# Patient Record
Sex: Female | Born: 1995 | Race: Black or African American | Hispanic: No | Marital: Single | Smoking: Never smoker
Health system: Southern US, Community
[De-identification: ages and names within clinical notes are randomized; demographics above are authoritative.]

## PROBLEM LIST (undated history)

## (undated) DIAGNOSIS — E559 Vitamin D deficiency, unspecified: Secondary | ICD-10-CM

## (undated) DIAGNOSIS — J45909 Unspecified asthma, uncomplicated: Secondary | ICD-10-CM

## (undated) DIAGNOSIS — F32A Depression, unspecified: Secondary | ICD-10-CM

## (undated) DIAGNOSIS — J302 Other seasonal allergic rhinitis: Secondary | ICD-10-CM

## (undated) DIAGNOSIS — F329 Major depressive disorder, single episode, unspecified: Secondary | ICD-10-CM

## (undated) DIAGNOSIS — D649 Anemia, unspecified: Secondary | ICD-10-CM

## (undated) DIAGNOSIS — M62838 Other muscle spasm: Secondary | ICD-10-CM

## (undated) DIAGNOSIS — R519 Headache, unspecified: Secondary | ICD-10-CM

## (undated) DIAGNOSIS — F419 Anxiety disorder, unspecified: Secondary | ICD-10-CM

## (undated) DIAGNOSIS — K219 Gastro-esophageal reflux disease without esophagitis: Secondary | ICD-10-CM

## (undated) HISTORY — DX: Anemia, unspecified: D64.9

## (undated) HISTORY — DX: Unspecified asthma, uncomplicated: J45.909

## (undated) HISTORY — DX: Other seasonal allergic rhinitis: J30.2

## (undated) HISTORY — DX: Headache, unspecified: R51.9

## (undated) HISTORY — DX: Depression, unspecified: F32.A

## (undated) HISTORY — DX: Gastro-esophageal reflux disease without esophagitis: K21.9

## (undated) HISTORY — DX: Other muscle spasm: M62.838

## (undated) HISTORY — DX: Vitamin D deficiency, unspecified: E55.9

## (undated) HISTORY — DX: Anxiety disorder, unspecified: F41.9

## (undated) HISTORY — PX: WISDOM TOOTH EXTRACTION: SHX21

---
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
## 2019-10-20 ENCOUNTER — Ambulatory Visit: Payer: Self-pay | Admitting: Neurology

## 2019-12-07 ENCOUNTER — Encounter: Payer: Self-pay | Admitting: Neurology

## 2019-12-07 ENCOUNTER — Other Ambulatory Visit: Payer: Self-pay

## 2019-12-07 ENCOUNTER — Ambulatory Visit: Payer: BC Managed Care – PPO | Admitting: Neurology

## 2019-12-07 VITALS — BP 121/80 | HR 70 | Temp 97.9°F | Ht 62.0 in | Wt 162.0 lb

## 2019-12-07 DIAGNOSIS — G43709 Chronic migraine without aura, not intractable, without status migrainosus: Secondary | ICD-10-CM

## 2019-12-07 DIAGNOSIS — R569 Unspecified convulsions: Secondary | ICD-10-CM | POA: Diagnosis not present

## 2019-12-07 DIAGNOSIS — IMO0002 Reserved for concepts with insufficient information to code with codable children: Secondary | ICD-10-CM | POA: Insufficient documentation

## 2019-12-07 MED ORDER — SUMATRIPTAN SUCCINATE 50 MG PO TABS: 50.0000 mg | ORAL_TABLET | ORAL | 6 refills | Status: DC | PRN

## 2019-12-07 NOTE — Progress Notes (Signed)
PATIENT: Lisa Potts DOB: August 19, 1996  Chief Complaint  Patient presents with  . Spasms    She is here with her mother, Steward Drone. She started having intermittent spasms in her abdomen in October 2020.  At first, her symptoms would occur all day. The frequency of the spasms have improved but still happen about ten minutes out of every day. Still only in the abdomen.   Marland Kitchen Headache    In September 2020, she had a daily headache for one month. She now estimates one headache day per week. The pain is throbbing in nature and sometimes responds to OTC NSAIDS. She will have nausea at times.   Marland Kitchen PCP    Lawerance Sabal, [place]     HISTORICAL  Lisa Potts is a 24 year old female, seen in request by her primary care PA [place], [place] for evaluation of abdomen spasm, frequent headaches, initial evaluation was on December 07, 2019.  I have reviewed and summarized the referring note from the referring physician.  She had past medical history of depression, anxiety, taking BuSpar 10 mg 3 times a day, Paxil 10 mg daily  She began to experience intermittent headaches since middle school, oftentimes lateralized with associated light noise sensitivity, pounding headaches, nausea, lasting for few hours  She went through stressful life event in June 2020, since then she suffered significant depression anxiety, starting receiving treatment since October, BuSpar 10 mg 3 times a day, Paxil 10 mg daily was added on since February 2021 for suboptimal control of her depression anxiety symptoms  Since June 2020, she began to experience increased frequency of headache, for a while, she was having daily headache lasting for 1 month, sometimes holoacranial, sometimes lateralized, failed to respond to over-the-counter medication, Excedrin migraine headaches, NSAIDs, with her depression treatment, her symptoms has much improved, she now only have headaches about once to twice each week  Also around September 2020, she began  to experience episode of body tremor, normal muscle tightness, to the point of drawing her body towards her knee, also has intermittent whole body tremor, lasting for few minutes to hours, without loss of consciousness  Laboratory evaluations in October 2020, normal iron panel, ferritin 105, B12 484, mild anemia, hemoglobin of 10.9, normal TSH, CMP,  REVIEW OF SYSTEMS: Full 14 system review of systems performed and notable only for as above All other review of systems were negative.  ALLERGIES: No Known Allergies  HOME MEDICATIONS: Current Outpatient Medications  Medication Sig Dispense Refill  . budesonide-formoterol (SYMBICORT) 160-4.5 MCG/ACT inhaler Inhale 2 puffs into the lungs 2 (two) times daily.    . busPIRone (BUSPAR) 10 MG tablet Take 10 mg by mouth 3 (three) times daily.    . cetirizine (ZYRTEC) 10 MG tablet Take 10 mg by mouth daily.    Marland Kitchen esomeprazole (NEXIUM) 40 MG capsule Take 40 mg by mouth daily at 12 noon.    . Ferrous Sulfate (IRON) 325 (65 Fe) MG TABS Take 2 tablets by mouth daily.    . montelukast (SINGULAIR) 10 MG tablet Take 10 mg by mouth at bedtime.    Marland Kitchen PARoxetine (PAXIL) 10 MG tablet Take 10 mg by mouth daily.    Marland Kitchen VITAMIN D PO Take 2,000 Units by mouth daily.     No current facility-administered medications for this visit.    PAST MEDICAL HISTORY: Past Medical History:  Diagnosis Date  . Acid reflux   . Anemia   . Anxiety   . Asthma   . Depression   .  Headache   . Muscle spasm   . Seasonal allergies   . Vitamin D deficiency     PAST SURGICAL HISTORY: Past Surgical History:  Procedure Laterality Date  . WISDOM TOOTH EXTRACTION      FAMILY HISTORY: Family History  Problem Relation Age of Onset  . Healthy Mother   . Healthy Father     SOCIAL HISTORY: Social History   Socioeconomic History  . Marital status: Single    Spouse name: Not on file  . Number of children: 0  . Years of education: 5  . Highest education level: High school  graduate  Occupational History  . Occupation: unemployed  Tobacco Use  . Smoking status: Never Smoker  . Smokeless tobacco: Never Used  Substance and Sexual Activity  . Alcohol use: Not Currently  . Drug use: Never  . Sexual activity: Not on file  Other Topics Concern  . Not on file  Social History Narrative   Right-handed.   No daily use of caffeine.   Lives with parents.   Social Determinants of Health   Financial Resource Strain:   . Difficulty of Paying Living Expenses: Not on file  Food Insecurity:   . Worried About Charity fundraiser in the Last Year: Not on file  . Ran Out of Food in the Last Year: Not on file  Transportation Needs:   . Lack of Transportation (Medical): Not on file  . Lack of Transportation (Non-Medical): Not on file  Physical Activity:   . Days of Exercise per Week: Not on file  . Minutes of Exercise per Session: Not on file  Stress:   . Feeling of Stress : Not on file  Social Connections:   . Frequency of Communication with Friends and Family: Not on file  . Frequency of Social Gatherings with Friends and Family: Not on file  . Attends Religious Services: Not on file  . Active Member of Clubs or Organizations: Not on file  . Attends Archivist Meetings: Not on file  . Marital Status: Not on file  Intimate Partner Violence:   . Fear of Current or Ex-Partner: Not on file  . Emotionally Abused: Not on file  . Physically Abused: Not on file  . Sexually Abused: Not on file     PHYSICAL EXAM   Vitals:   12/07/19 0936  BP: 121/80  Pulse: 70  Temp: 97.9 F (36.6 C)  Weight: 162 lb (73.5 kg)  Height: 5\' 2"  (1.575 m)    Not recorded      Body mass index is 29.63 kg/m.  PHYSICAL EXAMNIATION:  Gen: NAD, conversant, well nourised, well groomed                     Cardiovascular: Regular rate rhythm, no peripheral edema, warm, nontender. Eyes: Conjunctivae clear without exudates or hemorrhage Neck: Supple, no carotid bruits.  Pulmonary: Clear to auscultation bilaterally   NEUROLOGICAL EXAM:  MENTAL STATUS: Speech:    Speech is normal; fluent and spontaneous with normal comprehension.  Cognition:     Orientation to time, place and person     Normal recent and remote memory     Normal Attention span and concentration     Normal Language, naming, repeating,spontaneous speech     Fund of knowledge   CRANIAL NERVES: CN II: Visual fields are full to confrontation. Pupils are round equal and briskly reactive to light. CN III, IV, VI: extraocular movement are normal. No ptosis.  CN V: Facial sensation is intact to light touch CN VII: Face is symmetric with normal eye closure  CN VIII: Hearing is normal to causal conversation. CN IX, X: Phonation is normal. CN XI: Head turning and shoulder shrug are intact  MOTOR: There is no pronator drift of out-stretched arms. Muscle bulk and tone are normal. Muscle strength is normal.  REFLEXES: Reflexes are 2+ and symmetric at the biceps, triceps, knees, and ankles. Plantar responses are flexor.  SENSORY: Intact to light touch, pinprick and vibratory sensation are intact in fingers and toes.  COORDINATION: There is no trunk or limb dysmetria noted.  GAIT/STANCE: Posture is normal. Gait is steady with normal steps, base, arm swing, and turning. Heel and toe walking are normal. Tandem gait is normal.  Romberg is absent.   DIAGNOSTIC DATA (LABS, IMAGING, TESTING) - I reviewed patient records, labs, notes, testing and imaging myself where available.   ASSESSMENT AND PLAN  Arshi Cottier is a 24 y.o. female   Chronic migraine headaches  Imitrex 50 mg as needed  Seizure-like activity  In the setting of anxiety, most likely related to her mood disorder,  She desires complete evaluations, will proceed with MRI of the brain, EEG,  She will continue follow-up with her psychiatrist or primary care for better management of her mood disorder, only return to neurology  clinic if any of the above test is abnormal   Levert Feinstein, M.D. Ph.D.  [facility] [address] [place], [place] [address] Ph: [phone] Fax: [phone]  CC: Lawerance Sabal, [place]

## 2019-12-14 ENCOUNTER — Telehealth: Payer: Self-pay | Admitting: Neurology

## 2019-12-14 NOTE — Telephone Encounter (Signed)
spoke to the patient she does not want to have the MRI at this time  Yetta Numbers: [id number] (exp. 12/13/19 to 01/11/20)

## 2019-12-25 ENCOUNTER — Other Ambulatory Visit: Payer: BC Managed Care – PPO

## 2023-06-30 ENCOUNTER — Encounter: Payer: Self-pay | Admitting: Internal Medicine

## 2023-06-30 ENCOUNTER — Ambulatory Visit (INDEPENDENT_AMBULATORY_CARE_PROVIDER_SITE_OTHER): Payer: Self-pay | Admitting: Internal Medicine

## 2023-06-30 VITALS — BP 113/80 | HR 69 | Temp 97.5°F | Ht 63.0 in | Wt 162.1 lb

## 2023-06-30 DIAGNOSIS — K581 Irritable bowel syndrome with constipation: Secondary | ICD-10-CM

## 2023-06-30 DIAGNOSIS — K219 Gastro-esophageal reflux disease without esophagitis: Secondary | ICD-10-CM

## 2023-06-30 DIAGNOSIS — R1319 Other dysphagia: Secondary | ICD-10-CM

## 2023-06-30 MED ORDER — ESOMEPRAZOLE MAGNESIUM 40 MG PO CPDR: 40.0000 mg | DELAYED_RELEASE_CAPSULE | Freq: Two times a day (BID) | ORAL | 11 refills | Status: DC

## 2023-06-30 NOTE — Patient Instructions (Addendum)
I am going to increase your generic Nexium to twice daily.  Take first dose 30 minutes before breakfast second dose you can take before dinner or at bedtime.  If you gain insurance then let us know we can schedule you for upper endoscopy to further evaluate your symptoms.  For your constipation, I want you to start taking over the counter MiraLAX 1 capful daily.  If this does not adequately control your constipation, I would increase to 2 capfuls daily.  If this is still not adequate, then I would add on once daily Dulcolax (bisacodyl) tablet.   Be sure to drink at least 4 to 6 glasses of water daily.   Follow up in 6 months or sooner if needed.   It was very nice meeting you today.  Dr. Marletta Lor  Lifestyle and home remedies TO MANAGE REFLUX/HEARTBURN    You may eliminate or reduce the frequency of heartburn by making the following lifestyle changes:   Control your weight. Being overweight is a major risk factor for heartburn and GERD. Excess pounds put pressure on your abdomen, pushing up your stomach and causing acid to back up into your esophagus.    Eat smaller meals. 4 TO 6 MEALS A DAY. This reduces pressure on the lower esophageal sphincter, helping to prevent the valve from opening and acid from washing back into your esophagus.     Loosen your belt. Clothes that fit tightly around your waist put pressure on your abdomen and the lower esophageal sphincter.     Eliminate heartburn triggers. Everyone has specific triggers. Common triggers such as fatty or fried foods, spicy food, tomato sauce, carbonated beverages, alcohol, chocolate, mint, garlic, onion, caffeine and nicotine may make heartburn worse.    Avoid stooping or bending. Tying your shoes is OK. Bending over for longer periods to weed your garden isn't, especially soon after eating.    Don't lie down after a meal. Wait at least three to four hours after eating before going to bed, and don't lie down right after eating.

## 2023-06-30 NOTE — Progress Notes (Signed)
Primary Care Physician:  Lawerance Sabal, [place] Primary Gastroenterologist:  Dr. Marletta Lor  Chief Complaint  Patient presents with   New Patient (Initial Visit)    Patient here today due to issues with acid reflux. She is taking Esomeprazole 40 mg once daily. Patient says this is helping.    HPI:   Lisa Potts is a 27 y.o. female who presents to the clinic today by referral from her PCP Marveen Reeks for evaluation.  Patient states she has been dealing with worsening acid reflux for months.  Was taking over-the-counter Prilosec twice daily.  Having breakthrough symptoms, recently switched to esomeprazole 40 mg daily.  States her symptoms are improved though continues to have epigastric burning and pain 2-3 times daily.  Has started a food diary to identify certain triggers.    Also notes intermittent dysphagia feels like food gets stuck in her chest.  No regurgitation.  Eventually goes down.  Does have associated allergic rhinitis and asthma.  No previous upper endoscopy.  No melena hematochezia.  Also reports history of IBS.  Currently taking dicyclomine up to 4 times daily.  Does note some constipation type symptoms, occasional straining.  Abdominal pain improved on dicyclomine.  No previous colonoscopy.  No family history of colorectal malignancy.  Patient just turned 27 and is no longer under her parents insurance.  Currently paying out-of-pocket for everything.  Past Medical History:  Diagnosis Date   Acid reflux    Anemia    Anxiety    Asthma    Depression    Headache    Muscle spasm    Seasonal allergies    Vitamin D deficiency     Past Surgical History:  Procedure Laterality Date   WISDOM TOOTH EXTRACTION      Current Outpatient Medications  Medication Sig Dispense Refill   budesonide-formoterol (SYMBICORT) 160-4.5 MCG/ACT inhaler Inhale 2 puffs into the lungs 2 (two) times daily.     busPIRone (BUSPAR) 10 MG tablet Take 10 mg by mouth 3 (three) times daily.      cetirizine (ZYRTEC) 10 MG tablet Take 10 mg by mouth daily.     esomeprazole (NEXIUM) 40 MG capsule Take 40 mg by mouth daily at 12 noon.     montelukast (SINGULAIR) 10 MG tablet Take 10 mg by mouth at bedtime.     PARoxetine (PAXIL) 10 MG tablet Take 10 mg by mouth daily.     VITAMIN D PO Take 2,000 Units by mouth daily.     No current facility-administered medications for this visit.    Allergies as of 06/30/2023   (No Known Allergies)    Family History  Problem Relation Age of Onset   Healthy Mother    Healthy Father     Social History   Socioeconomic History   Marital status: Single    Spouse name: Not on file   Number of children: 0   Years of education: 12   Highest education level: High school graduate  Occupational History   Occupation: unemployed  Tobacco Use   Smoking status: Never   Smokeless tobacco: Never  Vaping Use   Vaping status: Never Used  Substance and Sexual Activity   Alcohol use: Not Currently   Drug use: Never   Sexual activity: Not on file  Other Topics Concern   Not on file  Social History Narrative   Right-handed.   No daily use of caffeine.   Lives with parents.   Social Determinants of Health  Financial Resource Strain: Not on file  Food Insecurity: Not on file  Transportation Needs: Not on file  Physical Activity: Not on file  Stress: Not on file  Social Connections: Not on file  Intimate Partner Violence: Not on file    Subjective: Review of Systems  Constitutional:  Negative for chills and fever.  HENT:  Negative for congestion and hearing loss.   Eyes:  Negative for blurred vision and double vision.  Respiratory:  Negative for cough and shortness of breath.   Cardiovascular:  Negative for chest pain and palpitations.  Gastrointestinal:  Positive for abdominal pain, constipation and heartburn. Negative for blood in stool, diarrhea, melena and vomiting.       Dysphagia  Genitourinary:  Negative for dysuria and urgency.   Musculoskeletal:  Negative for joint pain and myalgias.  Skin:  Negative for itching and rash.  Neurological:  Negative for dizziness and headaches.  Psychiatric/Behavioral:  Negative for depression. The patient is not nervous/anxious.        Objective: BP 113/80 (BP Location: Left Arm, Patient Position: Sitting, Cuff Size: Large)   Pulse 69   Temp (!) 97.5 F (36.4 C) (Temporal)   Ht 5\' 3"  (1.6 m)   Wt 162 lb 1.6 oz (73.5 kg)   LMP 06/28/2023 (Approximate)   BMI 28.71 kg/m  Physical Exam Constitutional:      Appearance: Normal appearance.  HENT:     Head: Normocephalic and atraumatic.  Eyes:     Extraocular Movements: Extraocular movements intact.     Conjunctiva/sclera: Conjunctivae normal.  Cardiovascular:     Rate and Rhythm: Normal rate and regular rhythm.  Pulmonary:     Effort: Pulmonary effort is normal.     Breath sounds: Normal breath sounds.  Abdominal:     General: Bowel sounds are normal.     Palpations: Abdomen is soft.  Musculoskeletal:        General: No swelling. Normal range of motion.     Cervical back: Normal range of motion and neck supple.  Skin:    General: Skin is warm and dry.     Coloration: Skin is not jaundiced.  Neurological:     General: No focal deficit present.     Mental Status: She is alert and oriented to person, place, and time.  Psychiatric:        Mood and Affect: Mood normal.        Behavior: Behavior normal.      Assessment/Plan:  1.  Chronic GERD, esophageal dysphagia-patient at risk for eosinophilic esophagitis with concurrent allergic rhinitis and asthma.  Discussed this in depth with her today.  Unfortunately she does not have insurance is very concerned about potential cost of upper endoscopy.  We will hold off for now.  Increase esomeprazole to twice daily.  Good Rx coupon provided today.  Patient currently applying for insurance, if obtained, she will call office and we can schedule her for upper endoscopy to  further evaluate.  ASA 2.  2.  Abdominal pain/constipation-likely has component of irritable bowel syndrome constipation predominant.  Currently taking dicyclomine.  Will continue. For patient's constipation, I recommended taking MiraLAX 1 capful daily.  If this is not adequate then increase to 2 capfuls daily.  If this is still not adequate then add on once daily Dulcolax.  Encouraged to drink at least 6 glasses of water daily.  Follow-up in 6 months or sooner if needed.  Thank you Kris Hartmann for the kind referral  06/30/2023  9:44 AM   Disclaimer: This note was dictated with voice recognition software. Similar sounding words can inadvertently be transcribed and may not be corrected upon review.

## 2023-12-28 ENCOUNTER — Ambulatory Visit (INDEPENDENT_AMBULATORY_CARE_PROVIDER_SITE_OTHER): Payer: Self-pay | Admitting: Gastroenterology

## 2023-12-28 VITALS — BP 115/73 | HR 76 | Temp 97.7°F | Ht 63.0 in | Wt 159.0 lb

## 2023-12-28 DIAGNOSIS — K5900 Constipation, unspecified: Secondary | ICD-10-CM

## 2023-12-28 DIAGNOSIS — K219 Gastro-esophageal reflux disease without esophagitis: Secondary | ICD-10-CM

## 2023-12-28 NOTE — Patient Instructions (Signed)
 Continue Nexium twice a day.   Make sure you are not laying down for at least 2-3 hours after eating.  Let's stop dicyclomine. Take Miralax 1 capful each evening. Let me know how this works for you!  We will see you in 3 months! It was a pleasure to see you today. I want to create trusting relationships with patients and provide genuine, compassionate, and quality care. I truly value your feedback, so please be on the lookout for a survey regarding your visit with me today. I appreciate your time in completing this!         Gelene Mink, PhD, ANP-BC [facility]

## 2023-12-28 NOTE — Progress Notes (Signed)
 Gastroenterology Office Note     Primary Care Physician:  Lawerance Sabal, [place]  Primary Gastroenterologist: Dr. Marletta Lor    Chief Complaint   Chief Complaint  Patient presents with   Follow-up    Doing good on medication. No problems or concerns     History of Present Illness   Lisa Potts is a 28 y.o. female presenting today with a history of chronic GERD, dysphagia, recommending EGD/dilation in future. Last seen in Sept 2024. Here for follow-up.    Nexium BID, dysphagia resolved. Sometimes chest burns but improved since last seen. Notably, she feels chest burning when laying down after eating.  No odynophaghia. Feels chest burning when laying down. Usually lays down after eating.   Dicyclomine one a day. Taking miralax three times a week. Slowly getting off of dicyclomine. BM twice a day. Constipation predominanty.     Past Medical History:  Diagnosis Date   Acid reflux    Anemia    Anxiety    Asthma    Depression    Headache    Muscle spasm    Seasonal allergies    Vitamin D deficiency     Past Surgical History:  Procedure Laterality Date   WISDOM TOOTH EXTRACTION      Current Outpatient Medications  Medication Sig Dispense Refill   budesonide-formoterol (SYMBICORT) 160-4.5 MCG/ACT inhaler Inhale 2 puffs into the lungs 2 (two) times daily.     busPIRone (BUSPAR) 10 MG tablet Take 10 mg by mouth 3 (three) times daily.     cetirizine (ZYRTEC) 10 MG tablet Take 10 mg by mouth daily.     esomeprazole (NEXIUM) 40 MG capsule Take 1 capsule (40 mg total) by mouth 2 (two) times daily before a meal. 60 capsule 11   montelukast (SINGULAIR) 10 MG tablet Take 10 mg by mouth at bedtime.     PARoxetine (PAXIL) 10 MG tablet Take 10 mg by mouth daily.     VITAMIN D PO Take 2,000 Units by mouth daily.     No current facility-administered medications for this visit.    Allergies as of 12/28/2023   (No Known Allergies)    Family History  Problem Relation Age of  Onset   Healthy Mother    Healthy Father     Social History   Socioeconomic History   Marital status: Single    Spouse name: Not on file   Number of children: 0   Years of education: 12   Highest education level: High school graduate  Occupational History   Occupation: unemployed  Tobacco Use   Smoking status: Never   Smokeless tobacco: Never  Vaping Use   Vaping status: Never Used  Substance and Sexual Activity   Alcohol use: Not Currently   Drug use: Never   Sexual activity: Not on file  Other Topics Concern   Not on file  Social History Narrative   Right-handed.   No daily use of caffeine.   Lives with parents.   Social Drivers of Corporate investment banker Strain: Not on file  Food Insecurity: Not on file  Transportation Needs: Not on file  Physical Activity: Not on file  Stress: Not on file  Social Connections: Not on file  Intimate Partner Violence: Not on file     Review of Systems   Gen: Denies any fever, chills, fatigue, weight loss, lack of appetite.  CV: Denies chest pain, heart palpitations, peripheral edema, syncope.  Resp: Denies shortness of breath at  rest or with exertion. Denies wheezing or cough.  GI: Denies dysphagia or odynophagia. Denies jaundice, hematemesis, fecal incontinence. GU : Denies urinary burning, urinary frequency, urinary hesitancy MS: Denies joint pain, muscle weakness, cramps, or limitation of movement.  Derm: Denies rash, itching, dry skin Psych: Denies depression, anxiety, memory loss, and confusion Heme: Denies bruising, bleeding, and enlarged lymph nodes.   Physical Exam   BP 115/73 (BP Location: Right Arm, Patient Position: Sitting, Cuff Size: Normal)   Pulse 76   Temp 97.7 F (36.5 C) (Rectal)   Ht 5\' 3"  (1.6 m)   Wt 159 lb (72.1 kg)   BMI 28.17 kg/m  General:   Alert and oriented. Pleasant and cooperative. Well-nourished and well-developed.  Head:  Normocephalic and atraumatic. Eyes:  Without  icterus Abdomen:  +BS, soft, non-tender and non-distended. No HSM noted. No guarding or rebound. No masses appreciated.  Rectal:  Deferred  Msk:  Symmetrical without gross deformities. Normal posture. Extremities:  Without edema. Neurologic:  Alert and  oriented x4;  grossly normal neurologically. Skin:  Intact without significant lesions or rashes. Psych:  Alert and cooperative. Normal mood and affect.   Assessment   Lisa Potts is a 28 y.o. female presenting today with a history of GERD, dysphagia, and constipation.  GERD: improved on Nexium BID. She also has had resolution of dysphagia and wants to hold off on EGD currently.  Constipation: wean off dicyclomine as only taking once a day. Query if contributing to constipation. Instead of playing "catch up" with Miralax sporadically, will try taking this once daily.    PLAN    Nexium BID Stop dicyclomine. Take miralax daily Avoid laying down for 2-3 hours after eating 3 month follow-up   Gelene Mink, PhD, ANP-BC [facility]

## 2024-03-28 ENCOUNTER — Ambulatory Visit: Payer: Self-pay | Admitting: Gastroenterology

## 2024-05-09 ENCOUNTER — Ambulatory Visit (INDEPENDENT_AMBULATORY_CARE_PROVIDER_SITE_OTHER): Payer: Self-pay | Admitting: Gastroenterology

## 2024-05-09 VITALS — BP 120/84 | HR 72 | Temp 98.6°F | Ht 63.0 in | Wt 148.8 lb

## 2024-05-09 DIAGNOSIS — K219 Gastro-esophageal reflux disease without esophagitis: Secondary | ICD-10-CM

## 2024-05-09 DIAGNOSIS — K5900 Constipation, unspecified: Secondary | ICD-10-CM

## 2024-05-09 NOTE — Patient Instructions (Signed)
 Let's try to decrease Nexium  to just once a day,  30 minutes before breakfast. It is best to take the lowest dose that treats your symptoms.   I recommend Benefiber 2 teaspoons every day to help with good bowel regimen. You can always take a dose of Miralax if you do not have a good bowel movement on any given day.   Let me know if any concerns in the meantime! We will see you in 6-8 months!  Happy birthday!  I enjoyed seeing you again today! I value our relationship and want to provide genuine, compassionate, and quality care. You may receive a survey regarding your visit with me, and I welcome your feedback! Thanks so much for taking the time to complete this. I look forward to seeing you again.      Therisa MICAEL Stager, PhD, ANP-BC [facility]

## 2024-05-09 NOTE — Progress Notes (Signed)
 Gastroenterology Office Note     Primary Care Physician:  Job Bolt, [place]  Primary Gastroenterologist: Dr. Cindie    Chief Complaint   Chief Complaint  Patient presents with   Follow-up    Follow up on GERD, pt not doing too much better. Still eating and drinking what she wants     History of Present Illness   Lisa Potts is a 28 y.o. female presenting today with a history of chronic GERD, dysphagia, last seen in March 2025.  At that visit we discussed her symptoms and she felt that her dysphagia had resolved, so it was decided to follow conservatively.  She is here for routine follow-up.  She is taking Nexium  twice a day and she denies any dysphagia.  She also states that she eats whatever she wants, and her reflux symptoms will flareup if she eats the wrong thing.  She has no problems swallowing.  She has no abdominal pain.  She states she has had some difficulty with depression over the past month but her primary care is aware.  She states that she has a bowel movement about 4 times a week.  She has not been taking MiraLAX as she has not felt she needed it.  She does skip a day here and there.  She is not taking any more dicyclomine.  She has no overt GI bleeding.  We discussed potential medications for constipation but she would like to avoid any prescriptions.  She would like to take MiraLAX only if needed.  She has not taken fiber in the past.  She notes that dairy will sometimes upset her stomach and make her nauseated or give her diarrhea.  She tries avoid this is much as possible.   Past Medical History:  Diagnosis Date   Acid reflux    Anemia    Anxiety    Asthma    Depression    Headache    Muscle spasm    Seasonal allergies    Vitamin D deficiency     Past Surgical History:  Procedure Laterality Date   WISDOM TOOTH EXTRACTION      Current Outpatient Medications  Medication Sig Dispense Refill   albuterol (VENTOLIN HFA) 108 (90 Base) MCG/ACT  inhaler Inhale into the lungs every 6 (six) hours as needed for wheezing or shortness of breath.     cetirizine (ZYRTEC) 10 MG tablet Take 10 mg by mouth daily.     esomeprazole  (NEXIUM ) 40 MG capsule Take 1 capsule (40 mg total) by mouth 2 (two) times daily before a meal. 60 capsule 11   VITAMIN D PO Take 2,000 Units by mouth daily.     budesonide-formoterol (SYMBICORT) 160-4.5 MCG/ACT inhaler Inhale 2 puffs into the lungs 2 (two) times daily. (Patient not taking: Reported on 05/09/2024)     busPIRone (BUSPAR) 10 MG tablet Take 10 mg by mouth 3 (three) times daily. (Patient not taking: Reported on 05/09/2024)     montelukast (SINGULAIR) 10 MG tablet Take 10 mg by mouth at bedtime. (Patient not taking: Reported on 05/09/2024)     PARoxetine (PAXIL) 10 MG tablet Take 10 mg by mouth daily. (Patient not taking: Reported on 05/09/2024)     No current facility-administered medications for this visit.    Allergies as of 05/09/2024   (No Known Allergies)    Family History  Problem Relation Age of Onset   Healthy Mother    Healthy Father     Social History   Socioeconomic  History   Marital status: Single    Spouse name: Not on file   Number of children: 0   Years of education: 12   Highest education level: High school graduate  Occupational History   Occupation: unemployed  Tobacco Use   Smoking status: Never   Smokeless tobacco: Never  Vaping Use   Vaping status: Never Used  Substance and Sexual Activity   Alcohol use: Not Currently   Drug use: Never   Sexual activity: Not on file  Other Topics Concern   Not on file  Social History Narrative   Right-handed.   No daily use of caffeine.   Lives with parents.   Social Drivers of Corporate investment banker Strain: Not on file  Food Insecurity: Not on file  Transportation Needs: Not on file  Physical Activity: Not on file  Stress: Not on file  Social Connections: Not on file  Intimate Partner Violence: Not on file      Review of Systems   Gen: Denies any fever, chills, fatigue, weight loss, lack of appetite.  CV: Denies chest pain, heart palpitations, peripheral edema, syncope.  Resp: Denies shortness of breath at rest or with exertion. Denies wheezing or cough.  GI: Denies dysphagia or odynophagia. Denies jaundice, hematemesis, fecal incontinence. GU : Denies urinary burning, urinary frequency, urinary hesitancy MS: Denies joint pain, muscle weakness, cramps, or limitation of movement.  Derm: Denies rash, itching, dry skin Psych: Denies depression, anxiety, memory loss, and confusion Heme: Denies bruising, bleeding, and enlarged lymph nodes.   Physical Exam   BP 120/84   Pulse 72   Temp 98.6 F (37 C)   Ht 5' 3 (1.6 m)   Wt 148 lb 12.8 oz (67.5 kg)   LMP 05/07/2024   BMI 26.36 kg/m  General:   Alert and oriented. Pleasant and cooperative. Well-nourished and well-developed.  Head:  Normocephalic and atraumatic. Eyes:  Without icterus Abdomen:  +BS, soft, non-tender and non-distended. No HSM noted. No guarding or rebound. No masses appreciated.  Rectal:  Deferred  Msk:  Symmetrical without gross deformities. Normal posture. Extremities:  Without edema. Neurologic:  Alert and  oriented x4;  grossly normal neurologically. Skin:  Intact without significant lesions or rashes. Psych:  Alert and cooperative. Normal mood and affect.   Assessment   Lisa Potts is a 28 y.o. female presenting today with a history of GERD, dysphagia, and constipation, presenting today for routine follow-up after last seen in March 2025.  GERD is well-controlled on Nexium  twice a day, and we discussed decreasing this to just once a day so that she can have the lowest dose effective for her symptoms.  Dysphagia has completely resolved.  We will hold off on EGD at this time unless she has recurrent symptoms.  Constipation is somewhat improved but I feel like she could have improvement with taking supplemental  fiber daily.  She could of course take MiraLAX if needed if any given day no good bowel movement.  She has no alarm signs or symptoms.   PLAN    Decrease Nexium  to once daily if tolerated.  We would like to stay with the lowest effective dosing  Start Benefiber 2 teaspoons daily.  I have also given her handout regarding this. Return in 6 to 8 months.  She will call if she has any concerns in the meantime.   Therisa MICAEL Stager, PhD, ANP-BC [facility]

## 2024-09-19 NOTE — Telephone Encounter (Signed)
 Patient needs appt as it has been 5 months since I have seen her.   Need more information before I can provide much update, as this is dealing with abdominal pain and not appropriate for MyChart.     Can use this template to ask her questions: (it is under the dot phrases, RGAabdominalpain).   Patient called with abdominal pain:  When did the abdominal pain start?:  Was there a specific event that triggered it (e.g. eating, trauma, fall)?:  Was it sudden or gradual?:  Is it intermittent or constant?:  How often does it occur?:  How long does each episode last?:  What does the pain feel like (sharp, dull, crampy, burning, pressure, stabbing)?:  Is the pain localized (RUQ/LUQ/RLQ/LLQ/epigastric) or generalized?:  Does it radiate anywhere?:  What makes the pain worse (movement, food, stress, specific positions)?:  What makes the pain better (rest, bowel movement, antacids, vomiting, medications)?:  Does the pain follow a pattern or specific time of day (e.g. after meals, early morning, wake you up from sleep)?:  Have you experienced this pain before?:  Has the pain gotten better, worse, or stayed the same?:  On a scale of 0 to 10, how bad is the pain?:   Current medications for the pain:   Any home remedies help?:   Current pharmacy:  Last office visit:  Last procedure:

## 2024-09-25 MED ORDER — ESOMEPRAZOLE MAGNESIUM 40 MG PO CPDR: 40.0000 mg | DELAYED_RELEASE_CAPSULE | Freq: Two times a day (BID) | ORAL | 11 refills | Status: DC

## 2024-09-25 NOTE — Telephone Encounter (Signed)
Phoned and LMOVM for the pt to return call 

## 2024-09-25 NOTE — Telephone Encounter (Signed)
 noted

## 2024-09-25 NOTE — Telephone Encounter (Signed)
 Phoned and advised the pt of the instructions for her medications and coming in for a visit. The pt also would like more Nexium  sent in. She is out of Miralax but she is going out to get more tomorrow. She states she has a visit in February. She stated she is looking at her insurance and cost of coming in the office. Wants to wait until February to come in

## 2024-09-25 NOTE — Telephone Encounter (Signed)
 Awesome, thank you! Could have gastritis, GERD worsening, and likely has some constipation contributing.   She can increase Nexium  to BID for now. Take Miralax daily  Let's have her come in the office for a visit.

## 2024-09-25 NOTE — Telephone Encounter (Signed)
 Patient called with abdominal pain:  When did the abdominal pain start?: about 1.5 weeks ago-- real pain Dec 7th  Was there a specific event that triggered it (e.g. eating, trauma, fall)?: no cannot remember  Was it sudden or gradual?: gradual  Is it intermittent or constant?: intermittent  How often does it occur?: when / she eats  How long does each episode last?: 4 hours  What does the pain feel like (sharp, dull, crampy, burning, pressure, stabbing)?: dull, sharp  Is the pain localized (RUQ/LUQ/RLQ/LLQ/epigastric) or generalized?: lt side  Does it radiate anywhere?: no  What makes the pain worse (movement, food, stress, specific positions)?: yes  What makes the pain better (rest, bowel movement, antacids, vomiting, medications)?: miralax slightly helps  Does the pain follow a pattern or specific time of day (e.g. after meals, early morning, wake you up from sleep)?: no  Have you experienced this pain before?: yes  Has the pain gotten better, worse, or stayed the same?: the same  On a scale of 0 to 10, how bad is the pain?: 8 to 10  Current medications for the pain: fiber and Miralax  Any home remedies help?: no   Current pharmacy: [facility] in [place] Last office visit: months ago  Last procedure: ?

## 2024-09-25 NOTE — Telephone Encounter (Signed)
 Responses to questions

## 2024-09-25 NOTE — Addendum Note (Signed)
 Addended by: SHIRLEAN THERISA ORN on: 09/25/2024 04:42 PM   Modules accepted: Orders

## 2024-10-23 ENCOUNTER — Telehealth: Payer: Self-pay

## 2024-10-23 MED ORDER — ESOMEPRAZOLE MAGNESIUM 40 MG PO CPDR: 40.0000 mg | DELAYED_RELEASE_CAPSULE | Freq: Two times a day (BID) | ORAL | 11 refills | Status: AC

## 2024-10-23 NOTE — Telephone Encounter (Signed)
 Prescription sent

## 2024-10-23 NOTE — Telephone Encounter (Signed)
 Pt has a new insurance and pharmacy states she needed new Rx for Esomeprazole  sent in. Last ov was 07/25 and she has upcoming appt in 02/26. Please advise

## 2024-10-23 NOTE — Addendum Note (Signed)
 Addended by: SHIRLEAN THERISA ORN on: 10/23/2024 05:46 PM   Modules accepted: Orders

## 2024-10-24 NOTE — Telephone Encounter (Signed)
 Noted. Pt made aware.

## 2024-12-05 ENCOUNTER — Ambulatory Visit: Payer: Self-pay | Admitting: Gastroenterology
